# Patient Record
Sex: Female | Born: 1955 | Race: Asian | Hispanic: No | Marital: Married | Smoking: Never smoker
Health system: Southern US, Community
[De-identification: ages and names within clinical notes are randomized; demographics above are authoritative.]

## PROBLEM LIST (undated history)

## (undated) DIAGNOSIS — I2089 Other forms of angina pectoris: Secondary | ICD-10-CM

## (undated) DIAGNOSIS — G510 Bell's palsy: Secondary | ICD-10-CM

## (undated) DIAGNOSIS — E7800 Pure hypercholesterolemia, unspecified: Secondary | ICD-10-CM

## (undated) DIAGNOSIS — I208 Other forms of angina pectoris: Secondary | ICD-10-CM

## (undated) DIAGNOSIS — E079 Disorder of thyroid, unspecified: Secondary | ICD-10-CM

## (undated) DIAGNOSIS — I10 Essential (primary) hypertension: Secondary | ICD-10-CM

---

## 2017-05-07 ENCOUNTER — Observation Stay (HOSPITAL_BASED_OUTPATIENT_CLINIC_OR_DEPARTMENT_OTHER)
Admission: EM | Admit: 2017-05-07 | Discharge: 2017-05-08 | Disposition: A | Discharge status: Home / Self Care | Payer: Self-pay | Attending: Cardiology | Admitting: Cardiology

## 2017-05-07 ENCOUNTER — Emergency Department (HOSPITAL_BASED_OUTPATIENT_CLINIC_OR_DEPARTMENT_OTHER): Payer: Self-pay

## 2017-05-07 ENCOUNTER — Encounter (HOSPITAL_BASED_OUTPATIENT_CLINIC_OR_DEPARTMENT_OTHER): Payer: Self-pay

## 2017-05-07 DIAGNOSIS — I2510 Atherosclerotic heart disease of native coronary artery without angina pectoris: Secondary | ICD-10-CM | POA: Insufficient documentation

## 2017-05-07 DIAGNOSIS — Z79899 Other long term (current) drug therapy: Secondary | ICD-10-CM | POA: Insufficient documentation

## 2017-05-07 DIAGNOSIS — Z7982 Long term (current) use of aspirin: Secondary | ICD-10-CM | POA: Insufficient documentation

## 2017-05-07 DIAGNOSIS — R079 Chest pain, unspecified: Principal | ICD-10-CM | POA: Diagnosis present

## 2017-05-07 DIAGNOSIS — Z881 Allergy status to other antibiotic agents status: Secondary | ICD-10-CM | POA: Insufficient documentation

## 2017-05-07 DIAGNOSIS — E785 Hyperlipidemia, unspecified: Secondary | ICD-10-CM | POA: Insufficient documentation

## 2017-05-07 DIAGNOSIS — I10 Essential (primary) hypertension: Secondary | ICD-10-CM | POA: Insufficient documentation

## 2017-05-07 HISTORY — DX: Other forms of angina pectoris: I20.8

## 2017-05-07 HISTORY — DX: Essential (primary) hypertension: I10

## 2017-05-07 HISTORY — DX: Pure hypercholesterolemia, unspecified: E78.00

## 2017-05-07 HISTORY — DX: Other forms of angina pectoris: I20.89

## 2017-05-07 HISTORY — DX: Disorder of thyroid, unspecified: E07.9

## 2017-05-07 HISTORY — DX: Bell's palsy: G51.0

## 2017-05-07 LAB — CBC
HEMATOCRIT: 41 % (ref 36.0–46.0)
HEMOGLOBIN: 14 g/dL (ref 12.0–15.0)
MCH: 30.5 pg (ref 26.0–34.0)
MCHC: 34.1 g/dL (ref 30.0–36.0)
MCV: 89.3 fL (ref 78.0–100.0)
PLATELETS: 369 10*3/uL (ref 150–400)
RBC: 4.59 MIL/uL (ref 3.87–5.11)
RDW: 12.9 % (ref 11.5–15.5)
WBC: 7.8 10*3/uL (ref 4.0–10.5)

## 2017-05-07 LAB — BASIC METABOLIC PANEL
ANION GAP: 10 (ref 5–15)
BUN: 15 mg/dL (ref 6–20)
CHLORIDE: 104 mmol/L (ref 101–111)
CO2: 26 mmol/L (ref 22–32)
Calcium: 9.5 mg/dL (ref 8.9–10.3)
Creatinine, Ser: 0.6 mg/dL (ref 0.44–1.00)
GFR calc Af Amer: >60 mL/min (ref >60)
GFR calc non Af Amer: >60 mL/min (ref >60)
Glucose, Bld: 115 mg/dL — ABNORMAL HIGH (ref 65–99)
POTASSIUM: 3.8 mmol/L (ref 3.5–5.1)
SODIUM: 140 mmol/L (ref 135–145)

## 2017-05-07 LAB — TROPONIN I
Troponin I: <0.03 ng/mL (ref <0.03)
Troponin I: <0.03 ng/mL (ref <0.03)

## 2017-05-07 MED ORDER — ACETAMINOPHEN 325 MG PO TABS: 650.0000 mg | ORAL_TABLET | Freq: Four times a day (QID) | ORAL | Status: DC | PRN

## 2017-05-07 MED ORDER — ATORVASTATIN CALCIUM 10 MG PO TABS: 10.0000 mg | ORAL_TABLET | Freq: Every day | ORAL | Status: DC

## 2017-05-07 MED ORDER — BISOPROLOL FUMARATE 5 MG PO TABS
5.0000 mg | ORAL_TABLET | Freq: Every day | ORAL | Status: DC
  Administered 2017-05-08: 5 mg via ORAL
  Filled 2017-05-07: qty 1

## 2017-05-07 MED ORDER — FENTANYL CITRATE (PF) 100 MCG/2ML IJ SOLN
50.0000 ug | Freq: Once | INTRAMUSCULAR | Status: AC
  Administered 2017-05-07: 50 ug via INTRAVENOUS
  Filled 2017-05-07: qty 2

## 2017-05-07 MED ORDER — AMLODIPINE BESYLATE 5 MG PO TABS: 5.0000 mg | ORAL_TABLET | Freq: Every day | ORAL | Status: DC

## 2017-05-07 MED ORDER — ASPIRIN 81 MG PO CHEW
81.0000 mg | CHEWABLE_TABLET | Freq: Every day | ORAL | Status: DC
  Administered 2017-05-08: 81 mg via ORAL
  Filled 2017-05-07: qty 1

## 2017-05-07 MED ORDER — RANOLAZINE ER 500 MG PO TB12
500.0000 mg | ORAL_TABLET | Freq: Two times a day (BID) | ORAL | Status: DC
  Filled 2017-05-07: qty 1

## 2017-05-07 MED ORDER — ADULT MULTIVITAMIN W/MINERALS CH: 1.0000 | ORAL_TABLET | Freq: Every day | ORAL | Status: DC

## 2017-05-07 NOTE — ED Notes (Signed)
Pt asked if she could possibly complete an outpatient stress test and not be admitted to the hospital due to the waiting time. MD notified and stated he would speak to patient and Family.

## 2017-05-07 NOTE — ED Notes (Signed)
Pt took for xray

## 2017-05-07 NOTE — ED Triage Notes (Signed)
C/o CP since 7am-NAD-steady gait

## 2017-05-07 NOTE — ED Provider Notes (Signed)
[facility]   CSN: [id number] Arrival date & time: 05/07/17  1134     History   Chief Complaint Chief Complaint  Patient presents with  . Chest Pain    HPI Erica Keller is a 61 y.o. female with past history of hypertension, angina presents with 2 days of intermittent chest pain. Patient reports that she has been experiencing intermittent midsternal chest pain for the last 2 days. She reports that today she woke up at approximately 7 AM and experienced pain again, prompting ED visit. She reports at onset pain was a 6/10. Currently she reports pain at a 2/10. Patient also reports that today she attempted to walk up some stairs and had some shortness of breath which was concerning for her. She reports the symptoms are worse with exertion but not with deep inspiration. Patient reports that she has had some nausea but denies any diaphoresis. She denies any alleviating or aggravating factors of symptoms. Patient reports that yesterday she did have some pain in the left shoulder that radiated down to her left upper extremity. Patient reports that she has a history of CAD and angina but denies any personal MIs. Patient denies any family history of MIs. Patient is not a current smoker. Patient denies any recent fever, chills, cough, abdominal pain, vomiting.  The history is provided by the patient.    Past Medical History:  Diagnosis Date  . Bell's palsy   . High cholesterol   . Hypertension   . Stable angina (HCC)   . Thyroid disease     There are no active problems to display for this patient.   Past Surgical History:  Procedure Laterality Date  . CESAREAN SECTION      OB History    No data available       Home Medications    Prior to Admission medications   Medication Sig Start Date End Date Taking? Authorizing Provider  amLODipine (NORVASC) 5 MG tablet Take 5 mg by mouth daily.   Yes [provider]  aspirin 81 MG chewable tablet  Chew by mouth daily.   Yes [provider]  atorvastatin (LIPITOR) 10 MG tablet Take 10 mg by mouth daily.   Yes [provider]  bisoprolol (ZEBETA) 5 MG tablet Take 5 mg by mouth daily.   Yes [provider]  ranolazine (RANEXA) 500 MG 12 hr tablet Take 500 mg by mouth 2 (two) times daily.   Yes [provider]    Family History No family history on file.  Social History Social History  Substance Use Topics  . Smoking status: Never Smoker  . Smokeless tobacco: Never Used  . Alcohol use No     Allergies   Erythromycin   Review of Systems Review of Systems  Constitutional: Negative for chills and fever.  HENT: Negative for congestion.   Respiratory: Negative for shortness of breath.   Cardiovascular: Positive for chest pain.  Gastrointestinal: Positive for nausea. Negative for abdominal pain, diarrhea and vomiting.  Genitourinary: Negative for dysuria and hematuria.  Musculoskeletal: Negative for back pain and neck pain.  Neurological: Negative for weakness and numbness.     Physical Exam Updated Vital Signs BP 123/75 (BP Location: Right Arm)   Pulse 62   Temp 97.9 F (36.6 C) (Oral)   Resp 14   Ht  (1.448 m)   Wt 55.3 kg (122 lb)   SpO2 100%   BMI 26.40 kg/m   Physical Exam  Constitutional: She is oriented to person, place, and time. She appears well-developed and well-nourished.  Sitting comfortably on examination table  HENT:  Head: Normocephalic and atraumatic.  Mouth/Throat: Oropharynx is clear and moist and mucous membranes are normal.  Eyes: Pupils are equal, round, and reactive to light. Conjunctivae, EOM and lids are normal.  Neck: Full passive range of motion without pain.  Cardiovascular: Normal rate, regular rhythm, normal heart sounds and normal pulses.  Exam reveals no gallop and no friction rub.   No murmur heard. Pulmonary/Chest: Effort normal and breath sounds normal.  No evidence of respiratory  distress. Able to speak in full sentences without difficulty. No tenderness to anterior chest wall. Pain is not reproduced with palpation of the chest or movement of the bilateral upper extremities.  Abdominal: Soft. Normal appearance. There is no tenderness. There is no rigidity and no guarding.  Musculoskeletal: Normal range of motion.  Neurological: She is alert and oriented to person, place, and time.  Skin: Skin is warm and dry. Capillary refill takes less than 2 seconds.  Psychiatric: She has a normal mood and affect. Her speech is normal.  Nursing note and vitals reviewed.    ED Treatments / Results  Labs (all labs ordered are listed, but only abnormal results are displayed) Labs Reviewed  BASIC METABOLIC PANEL - Abnormal; Notable for the following:       Result Value   Glucose, Bld 115 (*)    All other components within normal limits  CBC  TROPONIN I    EKG  EKG Interpretation  Date/Time:  Wednesday May 07 2017 11:44:15 EDT Ventricular Rate:  68 PR Interval:  156 QRS Duration: 84 QT Interval:  412 QTC Calculation: 438 R Axis:   -28 Text Interpretation:  Normal sinus rhythm Moderate voltage criteria for LVH, may be normal variant Borderline ECG No STEMI.  Confirmed by Alona Bene [phone]) on 05/07/2017 12:07:18 PM       Radiology Dg Chest 2 View  Result Date: 05/07/2017 CLINICAL DATA:  Chest pain EXAM: CHEST  2 VIEW COMPARISON:  None. FINDINGS: Normal heart size. Mild aortic tortuosity. Symmetric hila. There is artifact from EKG pads, most notable over the right upper chest. There is no edema, consolidation, effusion, or pneumothorax. No acute osseous finding. Remote mid left clavicle fracture. IMPRESSION: No evidence of active disease. Electronically Signed   By: Marnee Spring M.D.   On: 05/07/2017 12:41    Procedures Procedures (including critical care time)  Medications Ordered in ED Medications  fentaNYL (SUBLIMAZE) injection 50 mcg (50 mcg  Intravenous Given 05/07/17 1332)     Initial Impression / Assessment and Plan / ED Course  I have reviewed the triage vital signs and the nursing notes.  Pertinent labs & imaging results that were available during my care of the patient were reviewed by me and considered in my medical decision making (see chart for details).     61 year old female who presents with 2 days of intermittent chest pain that began again at 7 AM this morning. Patient also experienced some shortness of breath this morning. Patient is afebrile, non-toxic appearing, sitting comfortably on examination table. Vital signs reviewed and stable. Consider ACS etiology versus acute infectious etiology versus musculoskeletal pain versus unstable angina. We'll plan to check basic labs including CBC, BMP, troponin, chest x-ray, EKG.  Labs and imaging reviewed. BMP shows hyperglycemia otherwise unremarkable. CBC unremarkable. Troponin is negative. Chest x-ray is negative for any acute infectious etiology. EKG shows sinus rhythm,  rate 68. He does have inverted T waves in aVR, V1, V2.   Given patient's history, risk factors and current presentation, patient has heart square 4. Given her history, would likely benefit from admission for cardiac evaluation and possible stress test. Discussed patient with Dr. Jacqulyn Bath. Agrees with plan. Updated patient on plan. She is agreeable to admission. Reevaluation after pain medication shows that pain is a 0 out of 10. Will consult hospitalist for admission.  Discussed with Dr. Katrine Coho. Agrees with plan of admission. Given patient's lack of medical conditions and overall well appearance on lab work, would like cardiology to be consult for possible admission to cards.  Discussed with Dr. [place] (Cardiology). Will accept patient for admission for possible stress test and further cardiac evaluation.   Final Clinical Impressions(s) / ED Diagnoses   Final diagnoses:  Chest pain, unspecified type    New  Prescriptions New Prescriptions   No medications on file     Rosana Hoes 05/07/17 1512    Long, Arlyss Repress, MD 05/08/17 [phone]

## 2017-05-08 ENCOUNTER — Observation Stay (HOSPITAL_COMMUNITY): Payer: Self-pay

## 2017-05-08 DIAGNOSIS — E785 Hyperlipidemia, unspecified: Secondary | ICD-10-CM

## 2017-05-08 DIAGNOSIS — I10 Essential (primary) hypertension: Secondary | ICD-10-CM

## 2017-05-08 DIAGNOSIS — R0789 Other chest pain: Secondary | ICD-10-CM

## 2017-05-08 DIAGNOSIS — R079 Chest pain, unspecified: Secondary | ICD-10-CM

## 2017-05-08 DIAGNOSIS — E7800 Pure hypercholesterolemia, unspecified: Secondary | ICD-10-CM

## 2017-05-08 LAB — TROPONIN I: Troponin I: <0.03 ng/mL (ref <0.03)

## 2017-05-08 LAB — HIV ANTIBODY (ROUTINE TESTING W REFLEX): HIV Screen 4th Generation wRfx: NONREACTIVE

## 2017-05-08 MED ORDER — IOPAMIDOL (ISOVUE-370) INJECTION 76%
INTRAVENOUS | Status: AC
  Filled 2017-05-08: qty 100

## 2017-05-08 MED ORDER — PANTOPRAZOLE SODIUM 40 MG PO TBEC
40.0000 mg | DELAYED_RELEASE_TABLET | Freq: Every day | ORAL | Status: DC
  Administered 2017-05-08: 40 mg via ORAL
  Filled 2017-05-08: qty 1

## 2017-05-08 MED ORDER — PANTOPRAZOLE SODIUM 40 MG PO TBEC: 40.0000 mg | DELAYED_RELEASE_TABLET | Freq: Every day | ORAL | 0 refills | Status: AC

## 2017-05-08 MED ORDER — NITROGLYCERIN 0.4 MG SL SUBL
SUBLINGUAL_TABLET | SUBLINGUAL | Status: AC
  Filled 2017-05-08: qty 1

## 2017-05-08 NOTE — Progress Notes (Signed)
MD called for orders.

## 2017-05-08 NOTE — Progress Notes (Signed)
Patient took home medications in room, educated patient about importance of following physicians orders while in hospital. Patient agrees to plan.

## 2017-05-08 NOTE — Care Management Note (Signed)
Case Management Note  Patient Details  Name: Erica Keller MRN: [id number] Date of Birth: 05-05-1956  Subjective/Objective:  Chest Pain               Action/Plan: Patient lives in [place] with her spouse; she has been in the [place] since May 2018 and states that she is a permanent resident of the [place]; she is undetermined where she and her spouse wants to live - [place] vs [place] vs [place]; CM informed patient that the Artist will talk to her and her spouse about what she will qualify for and also the importance of deciding where she wants to live; Medicaid is specialized according to each State in which they live in. Patient is agreeable to follow up at the [facility] and [facility] for ongoing care until she returns home to [place] in 2 wks. CM will continue to follow for DCP.  Expected Discharge Date:      Possibly 05/09/2017            Expected Discharge Plan:  Home/Self Care  In-House Referral:   Financial Counselor  Discharge planning Services  CM Consult  Status of Service:  In process, will continue to follow  Reola Mosher [phone] 05/08/2017, 1:48 PM

## 2017-05-08 NOTE — H&P (Signed)
Cardiology History & Physical    Patient ID: Erica Keller MRN: [id number], DOB: [birth year] Date of Encounter: 05/08/2017, 12:36 AM Primary Physician: Patient, No Pcp Per  Chief Complaint: CP   HPI: Erica Keller is a 61 y.o. female with history of HTN, HLD, and reported coronary disease, who presents with CP.  In regard to her coronary disease, pt reports having a positive stress test remotely, but never had a subsequent cardiac catheterization.  She has had intermittent angina since, and per her account, takes ranolazine 500 mg BID for this.  Today, pt had 10-15 minute episodes of SSCP with radiation to the posterior aspect of the left shoulder, with some associated SOB.   The CP was exacerbated by exertion.  She denies PND, orthopnea, LE edema, palpitations or presyncope.    Given increased intensity of CP and associated SOB, [facility].  There ECG and initial labs were unremarkable, including 2 sets of troponins.  She was then admitted to [facility] for further evaluation and consideration of possible stress testing.  Upon my exam, she was resting comfortably and was CP free.  Of note, she lives in [place] and is visiting her sister in [place].  Past Medical History:  Diagnosis Date  . Bell's palsy   . High cholesterol   . Hypertension   . Stable angina (HCC)   . Thyroid disease      Surgical History:  Past Surgical History:  Procedure Laterality Date  . CESAREAN SECTION       Home Meds: Prior to Admission medications   Medication Sig Start Date End Date Taking? Authorizing Provider  amLODipine (NORVASC) 5 MG tablet Take 5 mg by mouth daily.   Yes [provider]  aspirin 81 MG chewable tablet Chew by mouth daily.   Yes [provider]  atorvastatin (LIPITOR) 10 MG tablet Take 10 mg by mouth daily.   Yes [provider]  bisoprolol (ZEBETA) 5 MG tablet Take 5 mg by mouth daily.   Yes [provider]  cetirizine (ZYRTEC ALLERGY) 10 MG tablet Take 10 mg by mouth daily as needed for allergies.   Yes [provider]  co-enzyme Q-10 30 MG capsule Take 30 mg by mouth every other day.   Yes [provider]  Multiple Vitamins-Minerals (MULTIVITAMIN WITH MINERALS) tablet Take 1 tablet by mouth daily.   Yes [provider]  naproxen sodium (ANAPROX) 220 MG tablet Take 220 mg by mouth daily as needed (pain).   Yes [provider]  ranolazine (RANEXA) 500 MG 12 hr tablet Take 500 mg by mouth 2 (two) times daily.   Yes [provider]    Allergies:  Allergies  Allergen Reactions  . Erythromycin     Social History   Social History  . Marital status: Married    Spouse name: N/A  . Number of children: N/A  . Years of education: N/A   Occupational History  . Not on file.   Social History Main Topics  . Smoking status: Never Smoker  . Smokeless tobacco: Never Used  . Alcohol use No  . Drug use: No  . Sexual activity: Not on file   Other Topics Concern  . Not on file   Social History Narrative  . No narrative on file     No family history on file.  Review of Systems: All other systems reviewed and are otherwise negative except as noted above.  Labs:  Lab Results  Component Value Date   WBC 7.8 05/07/2017   HGB 14.0 05/07/2017   HCT 41.0 05/07/2017   MCV 89.3 05/07/2017   PLT 369 05/07/2017    Recent Labs Lab 05/07/17 1219  NA 140  K 3.8  CL 104  CO2 26  BUN 15  CREATININE 0.60  CALCIUM 9.5  GLUCOSE 115*    Recent Labs  05/07/17 1219 05/07/17 1659  TROPONINI <0.03 <0.03   No results found for: CHOL, HDL, LDLCALC, TRIG No results found for: DDIMER  Radiology/Studies:  Dg Chest 2 View  Result Date: 05/07/2017 CLINICAL DATA:  Chest pain EXAM: CHEST  2 VIEW COMPARISON:  None. FINDINGS: Normal heart size. Mild aortic tortuosity. Symmetric hila. There is artifact from EKG pads, most notable over the  right upper chest. There is no edema, consolidation, effusion, or pneumothorax. No acute osseous finding. Remote mid left clavicle fracture. IMPRESSION: No evidence of active disease. Electronically Signed   By: Marnee Spring M.D.   On: 05/07/2017 12:41   Wt Readings from Last 3 Encounters:  05/07/17 55.2 kg (121 lb 12.8 oz)    EKG: NSR, LVH, PRWP, no significant ST-TW changes.  Physical Exam: Blood pressure 109/70, pulse (!) 56, temperature 98.4 F (36.9 C), temperature source Oral, resp. rate 18, height  (1.448 m), weight 55.2 kg (121 lb 12.8 oz), SpO2 99 %. Body mass index is 26.36 kg/m. General: Well developed, well nourished, in no acute distress. Head: Normocephalic, atraumatic, sclera non-icteric, no xanthomas, nares are without discharge.  Neck: Negative for carotid bruits. JVD not elevated. Lungs: Clear bilaterally to auscultation without wheezes, rales, or rhonchi. Breathing is unlabored. Heart: RRR with S1 S2. No murmurs, rubs, or gallops appreciated. Abdomen: Soft, non-tender, non-distended with normoactive bowel sounds. No hepatomegaly. No rebound/guarding. No obvious abdominal masses. Msk:  Strength and tone appear normal for age. Extremities: No clubbing or cyanosis. No edema.  Distal pedal pulses are 2+ and equal bilaterally. Neuro: Alert and oriented X 3. No focal deficit. No facial asymmetry. Moves all extremities spontaneously. Psych:  Responds to questions appropriately with a normal affect.    Assessment and Plan   61 y.o. female with history of HTN, HLD, and reported coronary disease, who presents with CP.  1.  CP:  Will check one more set of CBMs, and keep NPO for possible stress test in AM.  Pt's reported history of CAD is unclear, esepically she has not had a prior cath.  Will hold off on heparin gtt unless further pain dynamic ECG changes or positive enzymes.  Will hold off on ordering ranolazine until these details are clarified.  2.  HLD:  Continue home  statin.  3.  HTN: Continue home bisoprolol and amlodipine.   Benita Stabile MD 05/08/2017, 12:36 AM

## 2017-05-08 NOTE — Progress Notes (Signed)
Progress Note  Patient Name: Erica Keller Date of Encounter: 05/08/2017  Primary Cardiologist: [facility]  Subjective   Intermittent chest discomfort and palpitation.   Inpatient Medications    Scheduled Meds: . amLODipine  5 mg Oral Daily  . aspirin  81 mg Oral Daily  . atorvastatin  10 mg Oral Daily  . bisoprolol  5 mg Oral Daily  . multivitamin with minerals  1 tablet Oral Daily   Continuous Infusions:  PRN Meds: acetaminophen   Vital Signs    Vitals:   05/07/17 1921 05/07/17 1923 05/08/17 0030 05/08/17 0423  BP: (!) 149/86  109/70 121/74  Pulse: 70  (!) 56 64  Resp: Temp: 98.8 F (37.1 C)  98.4 F (36.9 C) 98.8 F (37.1 C)  TempSrc: Oral  Oral Oral  SpO2: 99%  99% 97%  Weight:  121 lb 12.8 oz (55.2 kg)  120 lb 1.9 oz (54.5 kg)  Height:   (1.448 m)      Intake/Output Summary (Last 24 hours) at 05/08/17 0803 Last data filed at 05/08/17 0803  Gross per 24 hour  Intake              240 ml  Output             1100 ml  Net             -860 ml   Filed Weights   05/07/17 1146 05/07/17 1923 05/08/17 0423  Weight: 122 lb (55.3 kg) 121 lb 12.8 oz (55.2 kg) 120 lb 1.9 oz (54.5 kg)    Telemetry    Sinus rhythm  - Personally Reviewed  ECG    Sr with TWI in lead III (new), resolved TWI in lead V3, LVH criteria  - Personally Reviewed  Physical Exam   GEN: No acute distress.   Neck: No JVD Cardiac: RRR, no murmurs, rubs, or gallops.  Respiratory: Clear to auscultation bilaterally. GI: Soft, nontender, non-distended  MS: No edema; No deformity. Neuro:  Nonfocal  Psych: Normal affect   Labs    Chemistry Recent Labs Lab 05/07/17 1219  NA 140  K 3.8  CL 104  CO2 26  GLUCOSE 115*  BUN 15  CREATININE 0.60  CALCIUM 9.5  GFRNONAA >60  GFRAA >60  ANIONGAP 10     Hematology Recent Labs Lab 05/07/17 1219  WBC 7.8  RBC 4.59  HGB 14.0  HCT 41.0  MCV 89.3  MCH 30.5  MCHC 34.1  RDW 12.9  PLT 369    Cardiac  Enzymes Recent Labs Lab 05/07/17 1219 05/07/17 1659 05/08/17 0007  TROPONINI <0.03 <0.03 <0.03    Radiology    Dg Chest 2 View  Result Date: 05/07/2017 CLINICAL DATA:  Chest pain EXAM: CHEST  2 VIEW COMPARISON:  None. FINDINGS: Normal heart size. Mild aortic tortuosity. Symmetric hila. There is artifact from EKG pads, most notable over the right upper chest. There is no edema, consolidation, effusion, or pneumothorax. No acute osseous finding. Remote mid left clavicle fracture. IMPRESSION: No evidence of active disease. Electronically Signed   By: Marnee Spring M.D.   On: 05/07/2017 12:41    Cardiac Studies   None  Patient Profile     61 y.o. female with history of HTN, HLD, and reported coronary disease, who presents with CP.  Patient states that she had abnormal stress test and echo in 2016. She was unable to finish her test due to fatigue.   Assessment &  Plan    1. Chest pain - intermittent. Worse yesterday radiating to back. Intermittent while here. Also has fluttering senstation while here. No arrhthymias on telemetry. Troponin negative. EKG with non specific changes. Will get CT coronary. Trial of PPI for possible GERD. Hold Renexa for not clear indications.   2. HTN - Stable on current regimen  3. HLD - Continue statin  For questions or updates, please contact [facility] Please consult [email or web address] for contact info under Cardiology/STEMI.      Signed, Manson Passey, PA  05/08/2017, 8:03 AM    Personally seen and examined. Agree with above.  Currently chest pain-free, sitting up on the edge of bed, husband in room. Visiting from [place]. No shortness of breath, no syncope  Exam: Alert and oriented 3 in no acute distress, regular rate and rhythm no murmurs, lungs are clear, abdomen soft, no edema  ECG personally reviewed unremarkable.  61 year old female on Ranexa from [place] with prior stress test that was normal approximately 2 years ago  here with atypical chest pain  Atypical chest pain  - CT of coronaries ordered. Discussed with Dr. Shirlee Latch.  - Her use of Ranexa makes me Jerline Pain that she has had atypical chest discomfort for time.  Essential hypertension stable, no changes  Hyperlipidemia-continue statin  If CT scan unremarkable, she may be discharged.  Donato Schultz, MD

## 2017-05-08 NOTE — Discharge Summary (Signed)
Discharge Summary    Patient ID: Erica Keller,  MRN: [id number], DOB/AGE: [birth year] 59 y.o.  Admit date: 05/07/2017 Discharge date: 05/08/2017  Primary Care Provider: Patient, No Pcp Per Primary Cardiologist: [facility]   Discharge Diagnoses    Active Problems:   Chest pain   Allergies Allergies  Allergen Reactions  . Erythromycin     Diagnostic Studies/Procedures    Coronary CTA  05/08/17 FINDINGS: Non-cardiac: See separate report from [facility].  Calcium Score:  0 Agatston units.  Coronary Arteries: Right dominant with no anomalies  LM:  No plaque or stenosis.  LAD system:  Large D1.  No plaque or stenosis.  Circumflex system: Small OM1. Large OM2. Small AV LCx after take-off of OM2. No plaque or stenosis.  RCA system:  No plaque or stenosis.  IMPRESSION: 1. Coronary artery calcium score of 0 Agatston units, suggesting low risk for future cardiac events.  2. No significant coronary disease noted on coronary CT angiography.  Erica Keller   History of Present Illness     61 y.o. female with history of HTN, HLD, and reported coronary disease who presents with CP.  Patient states that she had abnormal stress test and echo in 2016. She was unable to finish her test due to fatigue. She never had a subsequent cardiac catheterization.  She has had intermittent angina since, and per her account, takes ranolazine 500 mg BID for this.  She is resident of [place]. Visiting family here. Pt had 10-15 minute episodes of SSCP with radiation to the posterior aspect of the left shoulder, with some associated SOB.   The CP was exacerbated by exertion.  She denies PND, orthopnea, LE edema, palpitations or presyncope.  Given increased intensity of CP and associated SOB, pt presented to [facility].  There ECG and initial labs were unremarkable, including 2 sets of troponins.  She was then admitted to [facility] for further  evaluation. Renexa hold for no clear indication.  Hospital Course     Consultants: None   1. Chest pain - Her chest pain felt atypical. She has fluttering senstation while here. No arrhthymias on telemetry. Troponin negative. EKG with non specific changes. Trial of PPI for possible GERD. Hold Renexa for no clear indications. CT of coronaries showed Calcium score of 0 suggesting low risk of future cardiac events and no significant coronary disease.   2. HTN - Stable on current regimen  3. HLD - Continue statin  The patient has been seen by Dr. Anne Fu  today and deemed ready for discharge home. All follow-up appointments have been scheduled. Discharge medications are listed below.    Discharge Vitals Blood pressure (!) 110/50, pulse (!) 58, temperature 98 F (36.7 C), temperature source Oral, resp. rate 19, height  (1.448 m), weight 120 lb 1.9 oz (54.5 kg), SpO2 98 %.  Filed Weights   05/07/17 1146 05/07/17 1923 05/08/17 0423  Weight: 122 lb (55.3 kg) 121 lb 12.8 oz (55.2 kg) 120 lb 1.9 oz (54.5 kg)    Labs & Radiologic Studies     CBC  Recent Labs  05/07/17 1219  WBC 7.8  HGB 14.0  HCT 41.0  MCV 89.3  PLT 369   Basic Metabolic Panel  Recent Labs  05/07/17 1219  NA 140  K 3.8  CL 104  CO2 26  GLUCOSE 115*  BUN 15  CREATININE 0.60  CALCIUM 9.5   Cardiac Enzymes  Recent Labs  05/07/17 1219 05/07/17  1659 05/08/17 0007  TROPONINI <0.03 <0.03 <0.03   Dg Chest 2 View  Result Date: 05/07/2017 CLINICAL DATA:  Chest pain EXAM: CHEST  2 VIEW COMPARISON:  None. FINDINGS: Normal heart size. Mild aortic tortuosity. Symmetric hila. There is artifact from EKG pads, most notable over the right upper chest. There is no edema, consolidation, effusion, or pneumothorax. No acute osseous finding. Remote mid left clavicle fracture. IMPRESSION: No evidence of active disease. Electronically Signed   By: Marnee Spring M.D.   On: 05/07/2017 12:41   Ct Coronary Morph  W/cta Cor W/score W/ca W/cm &/or Wo/cm  Result Date: 05/08/2017 CLINICAL DATA:  Chest pain EXAM: Cardiac CTA MEDICATIONS: Sub lingual nitro .  TECHNIQUE: The patient was scanned on a Siemens 192 slice scanner. Gantry rotation speed was 240 msecs. Collimation was .6 mm. A 100 kV prospective scan was triggered in the ascending thoracic aorta at 65-75% of the R-R interval. Average HR during the scan was 65 bpm. The 3D data set was interpreted on a dedicated work station using MPR, MIP and VRT modes. A total of 80cc of contrast was used. FINDINGS: Non-cardiac: See separate report from [facility]. Calcium Score:  0 Agatston units. Coronary Arteries: Right dominant with no anomalies LM:  No plaque or stenosis. LAD system:  Large D1.  No plaque or stenosis. Circumflex system: Small OM1. Large OM2. Small AV LCx after take-off of OM2. No plaque or stenosis. RCA system:  No plaque or stenosis. IMPRESSION: 1. Coronary artery calcium score of 0 Agatston units, suggesting low risk for future cardiac events. 2. No significant coronary disease noted on coronary CT angiography. Erica Keller Electronically Signed   By: Marca Ancona M.D.   On: 05/08/2017 16:53    Disposition   Pt is being discharged home today in good condition.  Follow-up Plans & Appointments    Follow-up Information    cardiology Follow up.   Why:  Follow up with your cardiologist in [place] after you return.          Discharge Instructions    Diet - low sodium heart healthy    Complete by:  As directed    Increase activity slowly    Complete by:  As directed       Discharge Medications   Current Discharge Medication List    START taking these medications   Details  pantoprazole (PROTONIX) 40 MG tablet Take 1 tablet (40 mg total) by mouth daily. Qty: 30 tablet, Refills: 0      CONTINUE these medications which have NOT CHANGED   Details  amLODipine (NORVASC) 5 MG tablet Take 5 mg by mouth daily.    aspirin  81 MG chewable tablet Chew by mouth daily.    atorvastatin (LIPITOR) 10 MG tablet Take 10 mg by mouth daily.    bisoprolol (ZEBETA) 5 MG tablet Take 5 mg by mouth daily.    cetirizine (ZYRTEC ALLERGY) 10 MG tablet Take 10 mg by mouth daily as needed for allergies.    co-enzyme Q-10 30 MG capsule Take 30 mg by mouth every other day.    Multiple Vitamins-Minerals (MULTIVITAMIN WITH MINERALS) tablet Take 1 tablet by mouth daily.    naproxen sodium (ANAPROX) 220 MG tablet Take 220 mg by mouth daily as needed (pain).      STOP taking these medications     ranolazine (RANEXA) 500 MG 12 hr tablet         Outstanding Labs/Studies   None   Duration of  Discharge Encounter   Greater than 30 minutes including physician time.  Signed, Berton Bon NP-C 05/08/2017, 5:18 PM   Personally seen and examined. Agree with above.  CT of cors was normal. See above Atypical CP. OK for DC.  Donato Schultz, MD

## 2017-05-08 NOTE — Progress Notes (Signed)
Discharge instructions reviewed with patient and family, questions answered, verbalized understanding.  Patient awaiting family for ride home.

## 2019-05-10 IMAGING — CT CT HEART MORP W/ CTA COR W/ SCORE W/ CA W/CM &/OR W/O CM
4 of 7 series · 8 of 20 positions shown, 9 images · IV contrast (APPLIED)
Comparison: None.

EXAM:
OVER-READ INTERPRETATION  CT CHEST

The following report is an over-read performed by radiologist Dr.
Badnews Henraat [REDACTED] on 05/09/2017. This over-read
does not include interpretation of cardiac or coronary anatomy or
pathology. The coronary CTA interpretation by the cardiologist is
attached.
CLINICAL DATA: Chest pain
Cardiac CTA
MEDICATIONS:
Sub lingual nitro .4mg
TECHNIQUE: The patient was scanned on a Siemens [REDACTED]ice scanner. Gantry
rotation speed was 240 msecs. Collimation was .6 mm. A 100 kV
prospective scan was triggered in the ascending thoracic aorta at
65-75% of the R-R interval. Average HR during the scan was 65 bpm.
The 3D data set was interpreted on a dedicated work station using
MPR, MIP and VRT modes. A total of 80cc of contrast was used.

[Series 6: best diast 72 % · axial · 0.29mm/px · z∈[+1113,+1161]mm · 2 of 361 slices shown, 3 images]
[im 121/361  vessel]
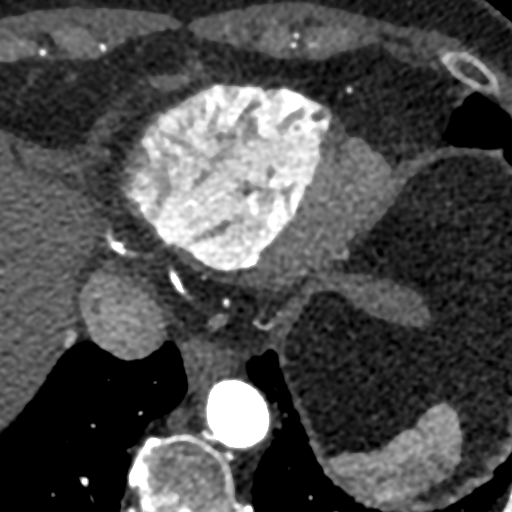
[im 121/361  lung]
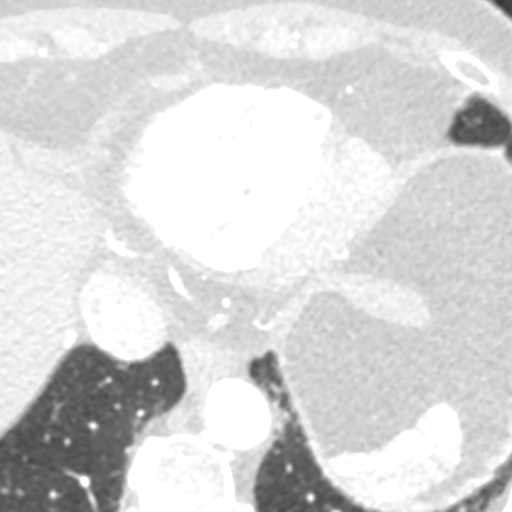
[im 241/361  vessel]
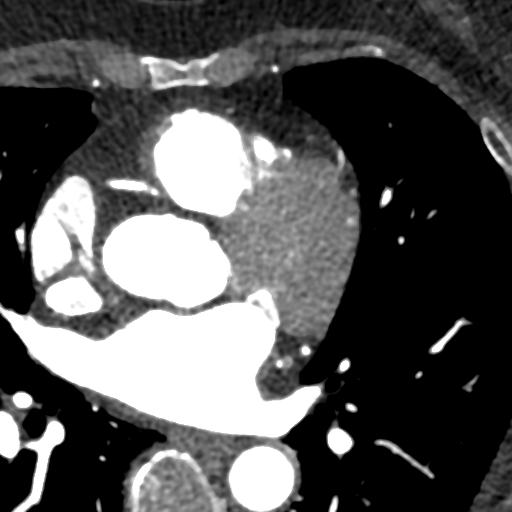

[Series 7: best syst 37 % · axial · 0.29mm/px · z∈[+1113,+1161]mm · 2 of 361 slices shown]
[im 121/361  vessel]
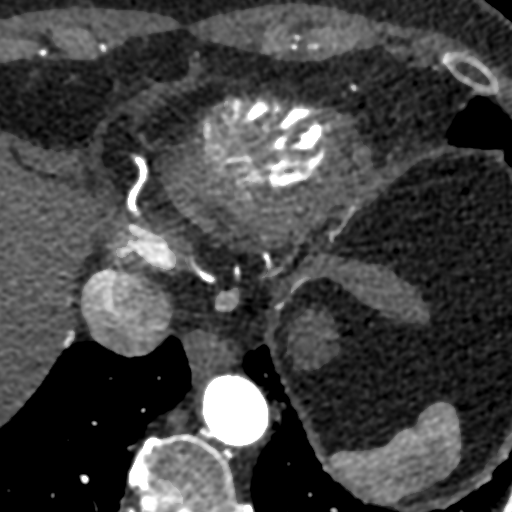
[im 241/361  vessel]
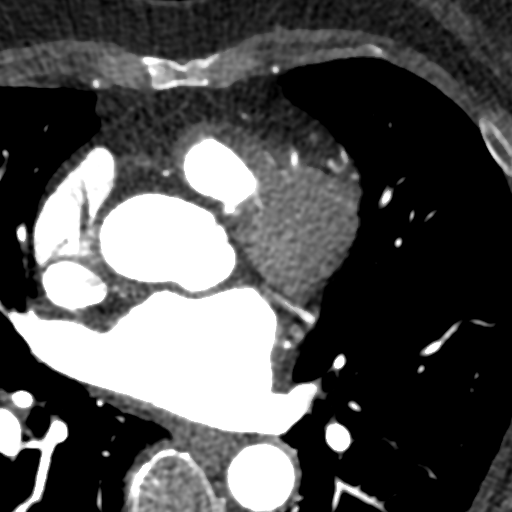

[Series 8: ts diast sharp 72 % · axial · 0.29mm/px · z∈[+1113,+1161]mm · 2 of 361 slices shown]
[im 121/361  lung]
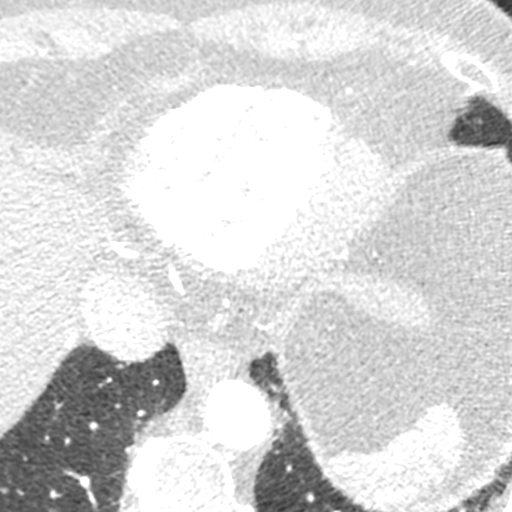
[im 241/361  lung]
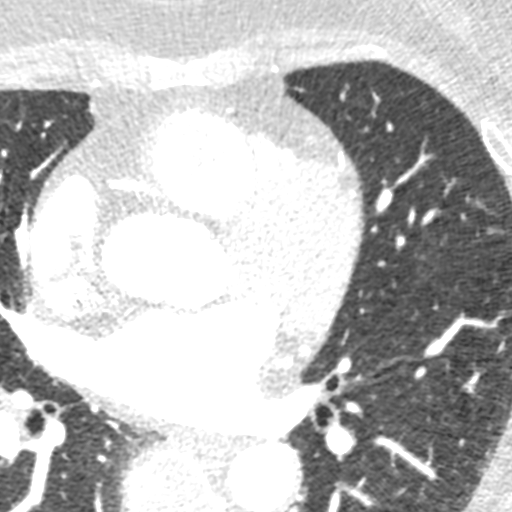

[Series 9: ts syst sharp 37 % · axial · 0.29mm/px · z∈[+1113,+1161]mm · 2 of 361 slices shown]
[im 121/361  lung]
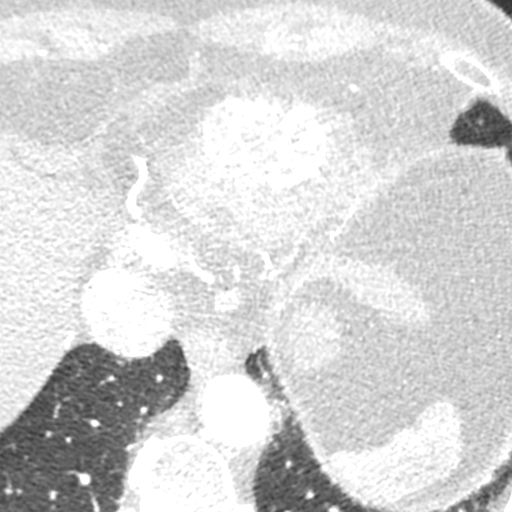
[im 241/361  lung]
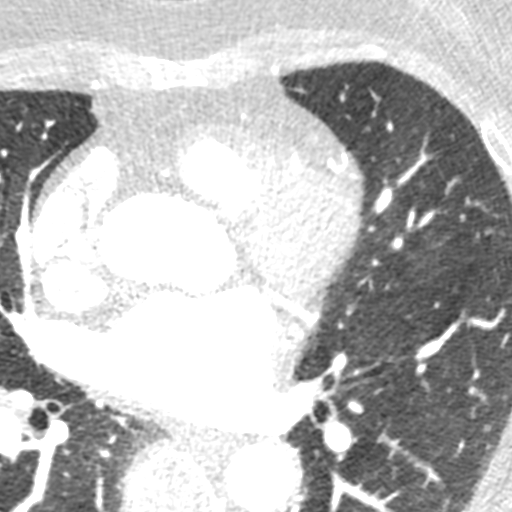

[8 of 20 positions shown; findings below may reference images not displayed]

FINDINGS: Aorta is normal caliber.  Heart is normal size.

No adenopathy in the lower mediastinum or hila.

Lungs clear.  No effusions.

Imaging into the upper abdomen shows no acute findings.

Chest wall soft tissues are unremarkable. No acute bony abnormality.
IMPRESSION: No acute or significant extracardiac abnormality.
FINDINGS: Non-cardiac: See separate report from [REDACTED].

Calcium Score:  0 Agatston units.

Coronary Arteries: Right dominant with no anomalies

LM:  No plaque or stenosis.

LAD system:  Large D1.  No plaque or stenosis.

Circumflex system: Small OM1. Large OM2. Small AV LCx after take-off
of OM2. No plaque or stenosis.

RCA system:  No plaque or stenosis.
IMPRESSION: 1. Coronary artery calcium score of 0 Agatston units, suggesting low
risk for future cardiac events.

2. No significant coronary disease noted on coronary CT angiography.

Triya Dinku
